# Patient Record
Sex: Female | Born: 1955 | Race: Black or African American | Hispanic: No | Marital: Married | Smoking: Never smoker
Health system: Southern US, Community
[De-identification: ages and names within clinical notes are randomized; demographics above are authoritative.]

## PROBLEM LIST (undated history)

## (undated) DIAGNOSIS — K219 Gastro-esophageal reflux disease without esophagitis: Secondary | ICD-10-CM

## (undated) DIAGNOSIS — I10 Essential (primary) hypertension: Secondary | ICD-10-CM

## (undated) DIAGNOSIS — K5900 Constipation, unspecified: Secondary | ICD-10-CM

## (undated) HISTORY — PX: ABDOMINAL HYSTERECTOMY: SHX81

---

## 2016-03-15 ENCOUNTER — Other Ambulatory Visit: Payer: Self-pay

## 2016-03-15 ENCOUNTER — Emergency Department (HOSPITAL_COMMUNITY): Payer: Medicaid Other

## 2016-03-15 ENCOUNTER — Encounter (HOSPITAL_COMMUNITY): Payer: Self-pay | Admitting: Emergency Medicine

## 2016-03-15 ENCOUNTER — Emergency Department (HOSPITAL_COMMUNITY)
Admission: EM | Admit: 2016-03-15 | Discharge: 2016-03-15 | Disposition: A | Discharge status: Home / Self Care | Payer: Medicaid Other | Attending: Emergency Medicine | Admitting: Emergency Medicine

## 2016-03-15 DIAGNOSIS — I10 Essential (primary) hypertension: Secondary | ICD-10-CM | POA: Insufficient documentation

## 2016-03-15 DIAGNOSIS — Z79899 Other long term (current) drug therapy: Secondary | ICD-10-CM | POA: Diagnosis not present

## 2016-03-15 DIAGNOSIS — R002 Palpitations: Secondary | ICD-10-CM | POA: Insufficient documentation

## 2016-03-15 DIAGNOSIS — R200 Anesthesia of skin: Secondary | ICD-10-CM | POA: Insufficient documentation

## 2016-03-15 HISTORY — DX: Gastro-esophageal reflux disease without esophagitis: K21.9

## 2016-03-15 HISTORY — DX: Constipation, unspecified: K59.00

## 2016-03-15 HISTORY — DX: Essential (primary) hypertension: I10

## 2016-03-15 LAB — COMPREHENSIVE METABOLIC PANEL
ALBUMIN: 3.5 g/dL (ref 3.5–5.0)
ALT: 23 U/L (ref 14–54)
ANION GAP: 7 (ref 5–15)
AST: 21 U/L (ref 15–41)
Alkaline Phosphatase: 77 U/L (ref 38–126)
BUN: 13 mg/dL (ref 6–20)
CHLORIDE: 102 mmol/L (ref 101–111)
CO2: 29 mmol/L (ref 22–32)
Calcium: 9.1 mg/dL (ref 8.9–10.3)
Creatinine, Ser: 1.02 mg/dL — ABNORMAL HIGH (ref 0.44–1.00)
GFR calc Af Amer: >60 mL/min (ref >60)
GFR calc non Af Amer: 59 mL/min — ABNORMAL LOW (ref >60)
GLUCOSE: 112 mg/dL — AB (ref 65–99)
POTASSIUM: 3.5 mmol/L (ref 3.5–5.1)
SODIUM: 138 mmol/L (ref 135–145)
Total Bilirubin: 0.2 mg/dL — ABNORMAL LOW (ref 0.3–1.2)
Total Protein: 7.1 g/dL (ref 6.5–8.1)

## 2016-03-15 LAB — CBC WITH DIFFERENTIAL/PLATELET
BASOS ABS: 0 10*3/uL (ref 0.0–0.1)
BASOS PCT: 0 %
EOS ABS: 0.1 10*3/uL (ref 0.0–0.7)
EOS PCT: 1 %
HCT: 37.2 % (ref 36.0–46.0)
Hemoglobin: 12.1 g/dL (ref 12.0–15.0)
Lymphocytes Relative: 40 %
Lymphs Abs: 2 10*3/uL (ref 0.7–4.0)
MCH: 30.4 pg (ref 26.0–34.0)
MCHC: 32.5 g/dL (ref 30.0–36.0)
MCV: 93.5 fL (ref 78.0–100.0)
MONO ABS: 0.3 10*3/uL (ref 0.1–1.0)
Monocytes Relative: 5 %
NEUTROS ABS: 2.6 10*3/uL (ref 1.7–7.7)
Neutrophils Relative %: 54 %
PLATELETS: 141 10*3/uL — AB (ref 150–400)
RBC: 3.98 MIL/uL (ref 3.87–5.11)
RDW: 13.2 % (ref 11.5–15.5)
WBC: 4.9 10*3/uL (ref 4.0–10.5)

## 2016-03-15 LAB — TROPONIN I

## 2016-03-15 LAB — TSH: TSH: 2.523 u[IU]/mL (ref 0.350–4.500)

## 2016-03-15 NOTE — ED Provider Notes (Signed)
[facility]-EMER[facility]ENCY DEPT Provider Note   CSN: [id number] Arrival date & time: 03/15/16  1247     History   Chief Complaint Chief Complaint  Patient presents with  . Palpitations    HPI Michaela Cross is a 61 y.o. female.  HPI  The patient is a 61 year old female, she is very obese and has high blood pressure as well as acid reflux and constipation. She presents to the hospital stating that over the course of the last 5 days she has had several episodes where she developed some palpitations in her chest almost like a fluttering sensation, she has also had intermittent numbness of her hands and states that both hands of the numb for a couple of days. It is a very slight numbness, she thinks it may be her right upper extremity is occasionally weak, she denies any swelling of the legs and has no numbness or weakness of the legs. There is no changes in vision, no difficulty with speech, no fevers chills nausea vomiting or diarrhea. She has had no unexpected weight loss, no chest pain, no abdominal pain, normal appetite. She is never had any cardiac abnormalities, she is not a diabetic, she does not use drugs, drink alcohol, smoke cigarettes and has not been using any over-the-counter medications. She denies coffee intake but does have a couple of sodas a day.  Past Medical History:  Diagnosis Date  . Constipation   . GERD (gastroesophageal reflux disease)   . Hypertension     There are no active problems to display for this patient.   Past Surgical History:  Procedure Laterality Date  . ABDOMINAL HYSTERECTOMY      OB History    Gravida Para Term Preterm AB Living             2   SAB TAB Ectopic Multiple Live Births                   Home Medications    Prior to Admission medications   Not on File    Family History History reviewed. No pertinent family history.  Social History Social History  Substance Use Topics  . Smoking status: Never Smoker  . Smokeless tobacco:  Never Used  . Alcohol use No     Allergies   Patient has no known allergies.   Review of Systems Review of Systems  All other systems reviewed and are negative.    Physical Exam Updated Vital Signs BP 123/68   Pulse 80   Resp 25   Ht 5\' 1"  (1.549 m)   Wt (!) 302 lb (137 kg)   SpO2 97%   BMI 57.06 kg/m   Physical Exam  Constitutional: She appears well-developed and well-nourished. No distress.  HENT:  Head: Normocephalic and atraumatic.  Mouth/Throat: Oropharynx is clear and moist. No oropharyngeal exudate.  Eyes: Conjunctivae and EOM are normal. Pupils are equal, round, and reactive to light. Right eye exhibits no discharge. Left eye exhibits no discharge. No scleral icterus.  Neck: Normal range of motion. Neck supple. No JVD present. No thyromegaly present.  Cardiovascular: Normal rate, regular rhythm, normal heart sounds and intact distal pulses.  Exam reveals no gallop and no friction rub.   No murmur heard. Pulmonary/Chest: Effort normal and breath sounds normal. No respiratory distress. She has no wheezes. She has no rales.  Abdominal: Soft. Bowel sounds are normal. She exhibits no distension and no mass. There is no tenderness.  Musculoskeletal: Normal range of motion. She exhibits  no edema or tenderness.  Lymphadenopathy:    She has no cervical adenopathy.  Neurological: She is alert. Coordination normal.  Speech is clear, cranial nerves III through XII are intact, memory is intact, strength is normal in all 4 extremities including grips, sensation is intact to light touch and pinprick in all 4 extremities. Coordination as tested by finger-nose-finger is normal, no limb ataxia. Normal gait, normal reflexes at the patellar tendons bilaterally  Skin: Skin is warm and dry. No rash noted. No erythema.  Psychiatric: She has a normal mood and affect. Her behavior is normal.  Nursing note and vitals reviewed.    ED Treatments / Results  Labs (all labs ordered are  listed, but only abnormal results are displayed) Labs Reviewed  CBC WITH DIFFERENTIAL/PLATELET - Abnormal; Notable for the following:       Result Value   Platelets 141 (*)    All other components within normal limits  COMPREHENSIVE METABOLIC PANEL - Abnormal; Notable for the following:    Glucose, Bld 112 (*)    Creatinine, Ser 1.02 (*)    Total Bilirubin 0.2 (*)    GFR calc non Af Amer 59 (*)    All other components within normal limits  TROPONIN I  TSH   ED ECG REPORT  I personally interpreted this EKG   Date: 03/15/2016   Rate: 76  Rhythm: normal sinus rhythm  QRS Axis: normal  Intervals: normal  ST/T Wave abnormalities: normal  Conduction Disutrbances:none  Narrative Interpretation:   Old EKG Reviewed: none available  Radiology Dg Chest 2 View  Result Date: 03/15/2016 CLINICAL DATA:  Cardiac palpitations.  Shortness of breath EXAM: CHEST  2 VIEW COMPARISON:  None. FINDINGS: The lungs are clear. Heart is borderline enlarged with pulmonary vascularity within normal limits. No adenopathy. No bone lesions. IMPRESSION: Mild cardiomegaly.  No edema or consolidation. Electronically Signed   By: Bretta Bang III M.D.   On: 03/15/2016 13:46   [name] Contrast  Result Date: 03/15/2016 CLINICAL DATA:  61 year old female with right upper extremity weakness. Hand numbness. Heart palpitations for 5 days. Initial encounter. EXAM: MRI HEAD WITHOUT CONTRAST TECHNIQUE: Multiplanar, multiecho pulse sequences of the brain and surrounding structures were obtained without intravenous contrast. COMPARISON:  None. FINDINGS: Brain: Cerebral volume is within normal limits. No restricted diffusion to suggest acute infarction. No midline shift, mass effect, evidence of mass lesion, ventriculomegaly, extra-axial collection or acute intracranial hemorrhage. Cervicomedullary junction and pituitary are within normal limits. Scattered small foci of cerebral white matter T2 and FLAIR hyperintensity,  scattered in a nonspecific configuration in both frontal lobes. Extent is moderate for age. No cortical encephalomalacia or chronic cerebral blood products. Deep gray matter nuclei, brainstem, and cerebellum are normal. Vascular: Major intracranial vascular flow voids appear normal. Skull and upper cervical spine: Negative. Sinuses/Orbits: Normal orbit soft tissues. Chronic left lamina papyracea fracture. Visualized paranasal sinuses and mastoids are well pneumatized. Other: Visible internal auditory structures appear normal. Negative scalp soft tissues. IMPRESSION: 1.  No acute intracranial abnormality. 2. Moderate for age nonspecific cerebral white matter signal changes. Top differential considerations include accelerated small vessel ischemia, sequelae of trauma, hypercoagulable state, vasculitis, migraines, prior infection or less likely demyelination. Electronically Signed   By: Odessa Fleming M.D.   On: 03/15/2016 14:13    Procedures Procedures (including critical care time)  Medications Ordered in ED Medications - No data to display   Initial Impression / Assessment and Plan / ED Course  I have reviewed  the triage vital signs and the nursing notes.  Pertinent labs & imaging results that were available during my care of the patient were reviewed by me and considered in my medical decision making (see chart for details).   Well appaering, Neuro sx and palpitations Unclear etiology MR brain, palpitations w/u with TSH as well Pt in agreement.  MR normal Labs reassuring No findings of stroke, or MI or arrhythmia while here Pt informed Stable for d/c. Pt agrteeable to outpatient holter.  Final Clinical Impressions(s) / ED Diagnoses   Final diagnoses:  Palpitations    New Prescriptions New Prescriptions   No medications on file     Eber HongBrian Akeia Perot, MD 03/15/16 1444

## 2016-03-15 NOTE — Discharge Instructions (Signed)

## 2016-03-15 NOTE — ED Triage Notes (Signed)
PT c/o hear palpations intermittently x5 days with SOB at times. PT denies any peripheral edema.

## 2020-07-06 ENCOUNTER — Emergency Department (HOSPITAL_COMMUNITY): Payer: BLUE CROSS/BLUE SHIELD

## 2020-07-06 ENCOUNTER — Emergency Department (HOSPITAL_COMMUNITY)
Admission: EM | Admit: 2020-07-06 | Discharge: 2020-07-06 | Disposition: A | Discharge status: Home / Self Care | Payer: BLUE CROSS/BLUE SHIELD | Attending: Emergency Medicine | Admitting: Emergency Medicine

## 2020-07-06 ENCOUNTER — Encounter (HOSPITAL_COMMUNITY): Payer: Self-pay | Admitting: *Deleted

## 2020-07-06 ENCOUNTER — Other Ambulatory Visit: Payer: Self-pay

## 2020-07-06 DIAGNOSIS — Z5321 Procedure and treatment not carried out due to patient leaving prior to being seen by health care provider: Secondary | ICD-10-CM | POA: Insufficient documentation

## 2020-07-06 DIAGNOSIS — R5383 Other fatigue: Secondary | ICD-10-CM | POA: Diagnosis not present

## 2020-07-06 DIAGNOSIS — R464 Slowness and poor responsiveness: Secondary | ICD-10-CM | POA: Insufficient documentation

## 2020-07-06 DIAGNOSIS — R531 Weakness: Secondary | ICD-10-CM | POA: Diagnosis present

## 2020-07-06 LAB — BASIC METABOLIC PANEL
Anion gap: 6 (ref 5–15)
BUN: 12 mg/dL (ref 8–23)
CO2: 29 mmol/L (ref 22–32)
Calcium: 9 mg/dL (ref 8.9–10.3)
Chloride: 103 mmol/L (ref 98–111)
Creatinine, Ser: 0.84 mg/dL (ref 0.44–1.00)
GFR, Estimated: >60 mL/min (ref >60)
Glucose, Bld: 90 mg/dL (ref 70–99)
Potassium: 3.8 mmol/L (ref 3.5–5.1)
Sodium: 138 mmol/L (ref 135–145)

## 2020-07-06 LAB — URINALYSIS, ROUTINE W REFLEX MICROSCOPIC
Bilirubin Urine: NEGATIVE
Glucose, UA: NEGATIVE mg/dL
Hgb urine dipstick: NEGATIVE
Ketones, ur: NEGATIVE mg/dL
Leukocytes,Ua: NEGATIVE
Nitrite: NEGATIVE
Protein, ur: NEGATIVE mg/dL
Specific Gravity, Urine: 1.015 (ref 1.005–1.030)
pH: 8.5 — ABNORMAL HIGH (ref 5.0–8.0)

## 2020-07-06 LAB — CBC WITH DIFFERENTIAL/PLATELET
Abs Immature Granulocytes: 0.01 10*3/uL (ref 0.00–0.07)
Basophils Absolute: 0 10*3/uL (ref 0.0–0.1)
Basophils Relative: 0 %
Eosinophils Absolute: 0.1 10*3/uL (ref 0.0–0.5)
Eosinophils Relative: 2 %
HCT: 42 % (ref 36.0–46.0)
Hemoglobin: 13.2 g/dL (ref 12.0–15.0)
Immature Granulocytes: 0 %
Lymphocytes Relative: 42 %
Lymphs Abs: 1.8 10*3/uL (ref 0.7–4.0)
MCH: 30.6 pg (ref 26.0–34.0)
MCHC: 31.4 g/dL (ref 30.0–36.0)
MCV: 97.4 fL (ref 80.0–100.0)
Monocytes Absolute: 0.4 10*3/uL (ref 0.1–1.0)
Monocytes Relative: 9 %
Neutro Abs: 2 10*3/uL (ref 1.7–7.7)
Neutrophils Relative %: 47 %
Platelets: 145 10*3/uL — ABNORMAL LOW (ref 150–400)
RBC: 4.31 MIL/uL (ref 3.87–5.11)
RDW: 12.5 % (ref 11.5–15.5)
WBC: 4.3 10*3/uL (ref 4.0–10.5)
nRBC: 0 % (ref 0.0–0.2)

## 2020-07-06 LAB — TROPONIN I (HIGH SENSITIVITY): Troponin I (High Sensitivity): <2 ng/L (ref <18)

## 2020-07-06 NOTE — ED Notes (Signed)
Screener stated that pt left about 5 min. Ago.

## 2020-07-06 NOTE — ED Provider Notes (Signed)
Emergency Medicine Provider Triage Evaluation Note  Michaela Cross , a 65 y.o. female  was evaluated in triage.  Patient here for evaluation of fatigue and generalized weakness.  States that she feels "sluggish" symptoms began today morning and subsided by the afternoon.  She woke this morning with similar symptoms.  Denies any symptoms at present.  Denies any chest pain, shortness of breath, known COVID exposure abdominal pain or headache.  Review of Systems  Positive: Generalized weakness, fatigue and sluggish Negative: Chest pain, shortness of breath, headache, extremity or facial weakness  Physical Exam  BP (!) 146/69 (BP Location: Right Arm)   Pulse 68   Temp 98.6 F (37 C) (Oral)   Resp 20   Ht 5\' 1"  (1.549 m)   Wt 131.1 kg   SpO2 99%   BMI 54.61 kg/m  Gen:   Awake, no distress   Resp:  Normal effort, lungs clear to auscultation bilaterally MSK:   Moves extremities without difficulty  Other:  1+ pitting edema of the bilateral lower extremities, no dysarthria or facial droop.  No pronator drift.  Medical Decision Making  Medically screening exam initiated at 1:56 PM.  Appropriate orders placed.  Mele Sylvester was informed that the remainder of the evaluation will be completed by another provider, this initial triage assessment does not replace that evaluation, and the importance of remaining in the ED until their evaluation is complete.  Patient here with symptoms of generalized weakness and feels "sluggish.  No known COVID exposures.  Symptoms began yesterday morning and have been intermittent.  Asymptomatic at present.  She will need further evaluation in the emergency department she is agreeable to this plan.   Keenan Bachelor, PA-C 07/06/20 1359    07/08/20, MD 07/07/20 [phone]

## 2020-07-06 NOTE — ED Triage Notes (Addendum)
Pt states she feels "sluggish".  Denies any one sided weakness, denies any pain.  Denies any known Covid exposure.  Denies any N/V/D or cough/fever.  States her head feels feeling since waking up yesterday morning, denies a HA.

## 2021-08-08 ENCOUNTER — Other Ambulatory Visit: Payer: Self-pay | Admitting: Orthopedic Surgery

## 2021-08-15 NOTE — TOC Initial Note (Signed)
Transition of Care [facility]) - Initial/Assessment Note    Patient Details  Name: Audrinna Sherman MRN: [id number] Date of Birth: [birth year]  Transition of Care [facility] CM/SW Contact:    Marlowe Sax, RN Phone Number: 08/15/2021, 4:32 PM  Clinical Narrative:                 The patient attended the joint class.  She reports that she does not have a 3 in 1 or a rolling walker and Needs both.  Request sent to [employer] on 7/26 to have the DME delivered to her home prior to surgery        Patient Goals and CMS Choice        Expected Discharge Plan and Services                                                Prior Living Arrangements/Services                       Activities of Daily Living      Permission Sought/Granted                  Emotional Assessment              Admission diagnosis:  Primary osteoarthritis of left knee  M17.12 Acute pain of left knee  M25.562 There are no problems to display for this patient.  PCP:  System, Provider Not In Pharmacy:   [facility] [place] - [place], [place] - [address] [place] [address] Phone: [phone] Fax: [phone]     Social Determinants of Health (SDOH) Interventions    Readmission Risk Interventions     No data to display

## 2021-08-27 ENCOUNTER — Inpatient Hospital Stay
Admission: RE | Admit: 2021-08-27 | Discharge: 2021-08-27 | Disposition: A | Discharge status: Home / Self Care | Payer: BLUE CROSS/BLUE SHIELD | Source: Ambulatory Visit

## 2021-08-27 NOTE — Patient Instructions (Signed)
Your procedure is scheduled on: 09/06/21 - Thursday Report to the Registration Desk on the 1st floor of the [facility]. To find out your arrival time, please call [phone] between 1PM - 3PM on: 09/05/21 - Wednesday If your arrival time is 6:00 am, do not arrive prior to that time as the [facility] entrance doors do not open until 6:00 am.  REMEMBER: Instructions that are not followed completely may result in serious medical risk, up to and including death; or upon the discretion of your surgeon and anesthesiologist your surgery may need to be rescheduled.  Do not eat food after midnight the night before surgery.  No gum chewing, lozengers or hard candies.  You may however, drink CLEAR liquids up to 2 hours before you are scheduled to arrive for your surgery. Do not drink anything within 2 hours of your scheduled arrival time.  Clear liquids include: - water  - apple juice without pulp - gatorade (not RED colors) - black coffee or tea (Do NOT add milk or creamers to the coffee or tea) Do NOT drink anything that is not on this list.  In addition, your doctor has ordered for you to drink the provided  Ensure Pre-Surgery Clear Carbohydrate Drink  Drinking this carbohydrate drink up to two hours before surgery helps to reduce insulin resistance and improve patient outcomes. Please complete drinking 2 hours prior to scheduled arrival time.  TAKE THESE MEDICATIONS THE MORNING OF SURGERY WITH A SIP OF WATER: - ranitidine (ZANTAC) , (take one the night before and one on the morning of surgery - helps to prevent nausea after surgery.)  One week prior to surgery: Stop taking 08/30/21: Stop Anti-inflammatories (NSAIDS) such as Advil, Aleve, Ibuprofen, Motrin, Naproxen, Naprosyn and Aspirin based products such as Excedrin, Goodys Powder, BC Powder.   Stop ANY OVER THE COUNTER supplements until after surgery.  You may however, continue to take Tylenol if needed for pain up until the day  of surgery.  No Alcohol for 24 hours before or after surgery.  No Smoking including e-cigarettes for 24 hours prior to surgery.  No chewable tobacco products for at least 6 hours prior to surgery.  No nicotine patches on the day of surgery.  Do not use any "recreational" drugs for at least a week prior to your surgery.  Please be advised that the combination of cocaine and anesthesia may have negative outcomes, up to and including death. If you test positive for cocaine, your surgery will be cancelled.  On the morning of surgery brush your teeth with toothpaste and water, you may rinse your mouth with mouthwash if you wish. Do not swallow any toothpaste or mouthwash.  Use CHG Soap or wipes as directed on instruction sheet.  Do not wear jewelry, make-up, hairpins, clips or nail polish.  Do not wear lotions, powders, or perfumes.   Do not shave body from the neck down 48 hours prior to surgery just in case you cut yourself which could leave a site for infection.  Also, freshly shaved skin may become irritated if using the CHG soap.  Contact lenses, hearing aids and dentures may not be worn into surgery.  Do not bring valuables to the hospital. [facility] is not responsible for any missing/lost belongings or valuables.   Notify your doctor if there is any change in your medical condition (cold, fever, infection).  Wear comfortable clothing (specific to your surgery type) to the hospital.  After surgery, you can help prevent lung  complications by doing breathing exercises.  Take deep breaths and cough every 1-2 hours. Your doctor may order a device called an Incentive Spirometer to help you take deep breaths. When coughing or sneezing, hold a pillow firmly against your incision with both hands. This is called "splinting." Doing this helps protect your incision. It also decreases belly discomfort.  If you are being admitted to the hospital overnight, leave your suitcase in the  car. After surgery it may be brought to your room.  If you are being discharged the day of surgery, you will not be allowed to drive home. You will need a responsible adult (18 years or older) to drive you home and stay with you that night.   If you are taking public transportation, you will need to have a responsible adult (18 years or older) with you. Please confirm with your physician that it is acceptable to use public transportation.   Please call the [facility] at [phone] if you have any questions about these instructions.  Surgery Visitation Policy:  Patients undergoing a surgery or procedure may have two family members or support persons with them as long as the person is not COVID-19 positive or experiencing its symptoms.   Inpatient Visitation:    Visiting hours are 7 a.m. to 8 p.m. Up to four visitors are allowed at one time in a patient room, including children. The visitors may rotate out with other people during the day. One designated support person (adult) may remain overnight.

## 2021-09-06 ENCOUNTER — Ambulatory Visit: Admit: 2021-09-06 | Payer: BLUE CROSS/BLUE SHIELD | Admitting: Orthopedic Surgery

## 2021-09-06 SURGERY — ARTHROPLASTY, KNEE, TOTAL
Anesthesia: Choice | Site: Knee | Laterality: Left

## 2022-02-09 IMAGING — DX DG CHEST 1V PORT
1 series · 1 of 1 positions shown · non-contrast
Comparison: 09/17/2017 chest radiograph.

CLINICAL DATA: Weakness

EXAM:
PORTABLE CHEST 1 VIEW

[chest ap]
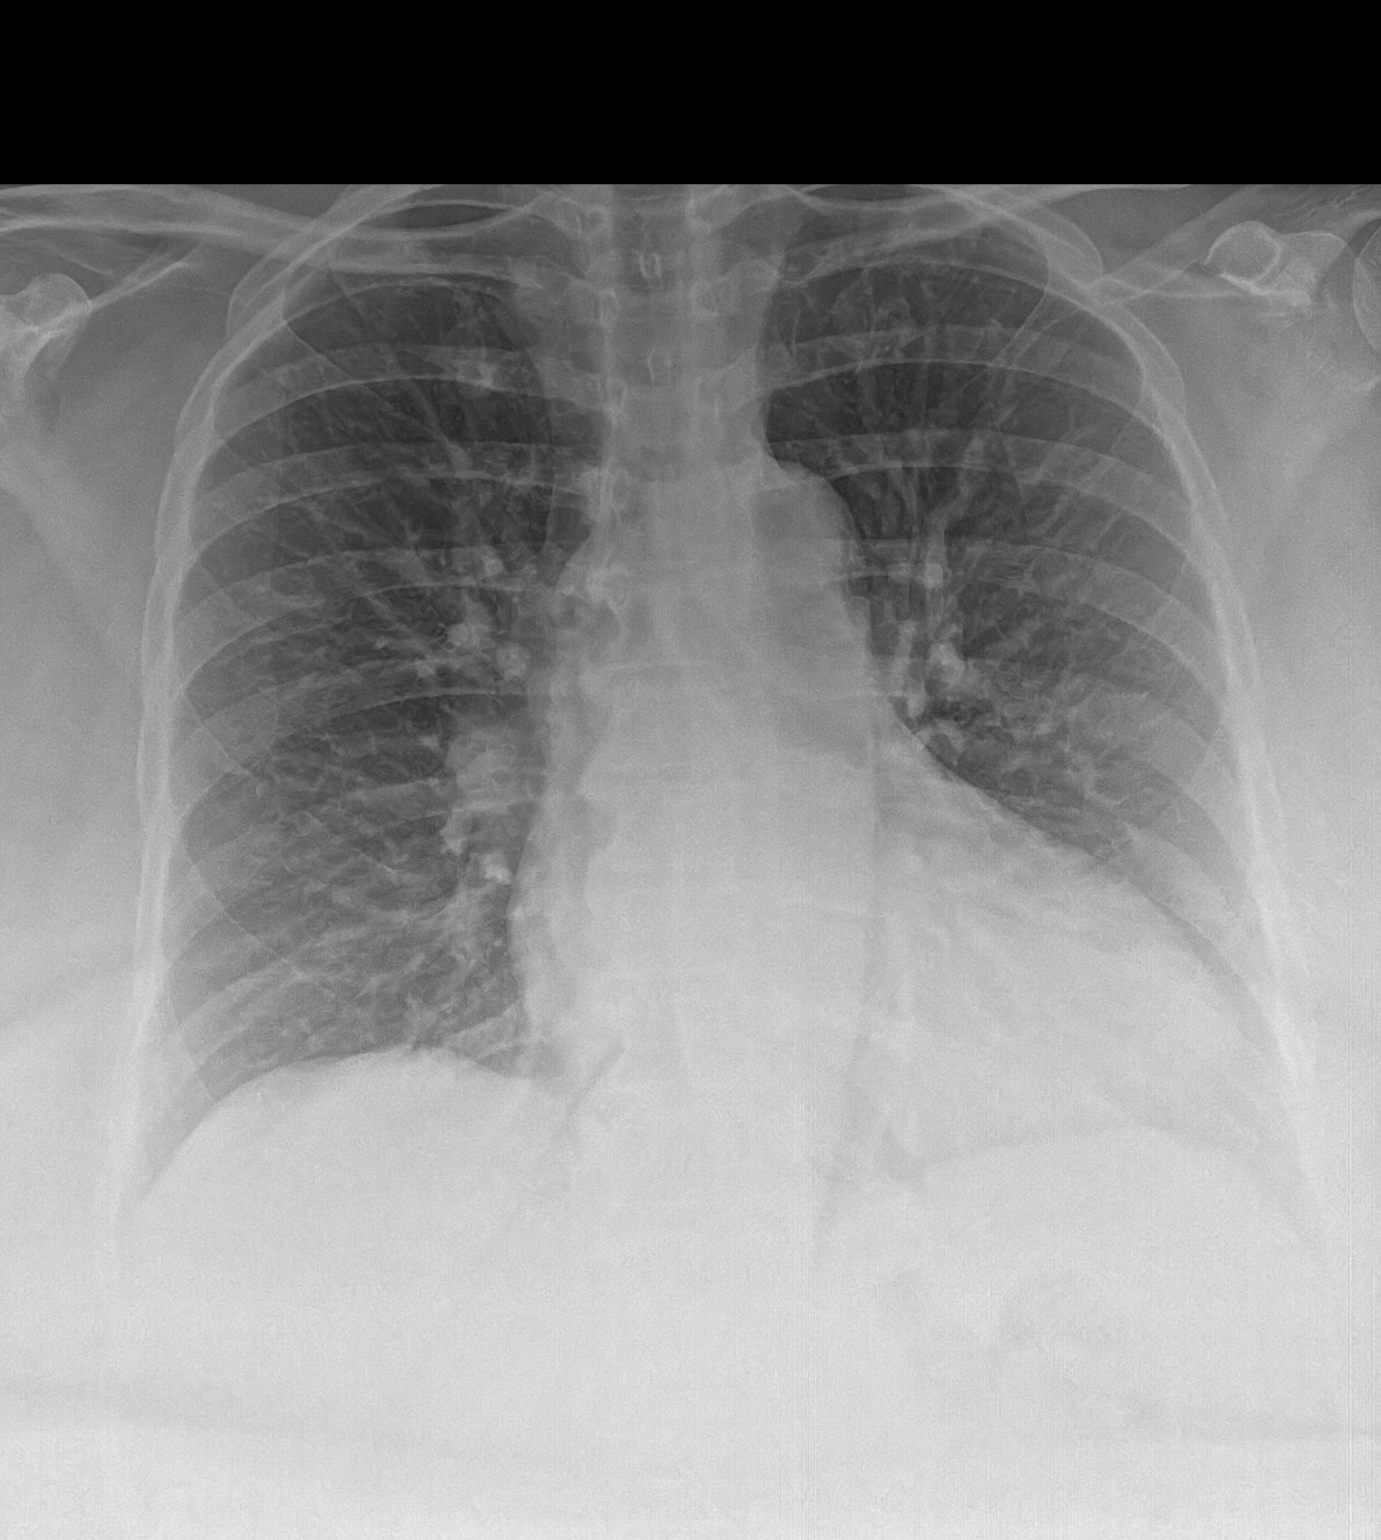

[1 of 1 positions shown; findings below may reference images not displayed]

FINDINGS: In Stable cardiomediastinal silhouette with top-normal heart size.
No pneumothorax. No pleural effusion. Cephalization of the pulmonary
vasculature without overt pulmonary edema. No acute consolidative
airspace disease.
IMPRESSION: Cephalization of the pulmonary vasculature without overt pulmonary
edema. No acute cardiopulmonary disease.
